# Patient Record
Sex: Male | Born: 1950 | Race: White | Hispanic: No | Marital: Married | Smoking: Never smoker
Health system: Southern US, Community
[De-identification: ages and names within clinical notes are randomized; demographics above are authoritative.]

## PROBLEM LIST (undated history)

## (undated) DIAGNOSIS — N401 Enlarged prostate with lower urinary tract symptoms: Secondary | ICD-10-CM

## (undated) DIAGNOSIS — C61 Malignant neoplasm of prostate: Secondary | ICD-10-CM

## (undated) DIAGNOSIS — Z87820 Personal history of traumatic brain injury: Secondary | ICD-10-CM

## (undated) DIAGNOSIS — G43109 Migraine with aura, not intractable, without status migrainosus: Secondary | ICD-10-CM

## (undated) DIAGNOSIS — N529 Male erectile dysfunction, unspecified: Secondary | ICD-10-CM

## (undated) DIAGNOSIS — Z973 Presence of spectacles and contact lenses: Secondary | ICD-10-CM

## (undated) DIAGNOSIS — Z87442 Personal history of urinary calculi: Secondary | ICD-10-CM

## (undated) HISTORY — PX: CYSTOSCOPY/URETEROSCOPY/HOLMIUM LASER: SHX6545

---

## 1976-11-21 DIAGNOSIS — Z87820 Personal history of traumatic brain injury: Secondary | ICD-10-CM

## 1976-11-21 HISTORY — DX: Personal history of traumatic brain injury: Z87.820

## 1994-11-21 HISTORY — PX: LUMBAR FUSION: SHX111

## 2017-09-07 HISTORY — PX: PROSTATE BIOPSY: SHX241

## 2017-09-26 ENCOUNTER — Encounter: Payer: Self-pay | Admitting: Radiation Oncology

## 2017-10-08 DIAGNOSIS — C61 Malignant neoplasm of prostate: Secondary | ICD-10-CM | POA: Insufficient documentation

## 2017-10-09 ENCOUNTER — Encounter: Payer: Self-pay | Admitting: Medical Oncology

## 2017-10-09 ENCOUNTER — Ambulatory Visit
Admission: RE | Admit: 2017-10-09 | Discharge: 2017-10-09 | Disposition: A | Discharge status: Home / Self Care | Payer: Medicare Other | Source: Ambulatory Visit | Attending: Radiation Oncology | Admitting: Radiation Oncology

## 2017-10-09 ENCOUNTER — Encounter: Payer: Self-pay | Admitting: Radiation Oncology

## 2017-10-09 ENCOUNTER — Other Ambulatory Visit: Payer: Self-pay

## 2017-10-09 VITALS — BP 145/95 | HR 57 | Ht 70.0 in | Wt 257.2 lb

## 2017-10-09 DIAGNOSIS — C61 Malignant neoplasm of prostate: Secondary | ICD-10-CM | POA: Diagnosis not present

## 2017-10-09 DIAGNOSIS — Z510 Encounter for antineoplastic radiation therapy: Secondary | ICD-10-CM | POA: Insufficient documentation

## 2017-10-09 DIAGNOSIS — Z7982 Long term (current) use of aspirin: Secondary | ICD-10-CM | POA: Diagnosis not present

## 2017-10-09 DIAGNOSIS — Z87442 Personal history of urinary calculi: Secondary | ICD-10-CM | POA: Diagnosis not present

## 2017-10-09 HISTORY — DX: Male erectile dysfunction, unspecified: N52.9

## 2017-10-09 HISTORY — DX: Malignant neoplasm of prostate: C61

## 2017-10-09 NOTE — Progress Notes (Addendum)
GU Location of Tumor / Histology: prostatic adenocarcinoma  If Prostate Cancer, Gleason Score is (4 + 3) and PSA is (7.90). Prostate volume: 53 cc.   [name] presented  Biopsies of prostate (if applicable) revealed:    Past/Anticipated interventions by urology, if any: biopsy, referral to radiation oncology to discuss options   Past/Anticipated interventions by medical oncology, if any: no  Weight changes, if any: 8 lb weight loss over 4 months due to increased exercise  Bowel/Bladder complaints, if any: Denies dysuria or hematuria. Reports increased nocturia. Reports one episode of leakage last week while pumping gas.   Nausea/Vomiting, if any: no  Pain issues, if any:  Left knee pain improving slowly without medical interention.  SAFETY ISSUES:  Prior radiation? no  Pacemaker/ICD? unknown  Possible current pregnancy? no  Is the patient on methotrexate? Unknown   Current Complaints / other details:  66 year old male. Married. Prefers to go by Jerry Gordon. Scheduled to follow up with Dr. Tresa Moore, urologist, on December 4. Daughter with hx of hodgkin's lymphoma behind breast bone.   Patient reports the following: 2015  PSA  2 (blood drawn in PCP office) 2016  PSA  4 (blood drawn in PCP office) 02/2017  PSA  7 (blood drawn in PCP office)  Explains a "dear friend recently had a prostatectomy."

## 2017-10-09 NOTE — Progress Notes (Signed)
I introduced myself to "Jerry Gordon" and his wife as the prostate nurse navigator and my role. He is here today to discuss his radiation treatment options. After discussion with Ebony Hail and Dr. Tammi Klippel, he needs to think about his options before making a decision.

## 2017-10-09 NOTE — Progress Notes (Signed)
See progress note under physician encounter. 

## 2017-10-09 NOTE — Progress Notes (Signed)
Radiation Oncology         [phone]) [phone] ________________________________  Initial outpatient Consultation  Name: Jerry Gordon MRN: [id number]  Date: 10/09/2017  DOB: October 02, 1951  HQ:[id number], No Pcp Per  Alexis Frock, MD   REFERRING PHYSICIAN: Alexis Frock, MD  DIAGNOSIS: 66 y.o. gentleman with stage T1c adenocarcinoma of the prostate with a Gleason's score of 4+3 and a PSA of 7.9    ICD-10-CM   1. Malignant neoplasm of prostate ([place]) C61     HISTORY OF PRESENT ILLNESS::Jerry Gordon is a 66 y.o. gentleman.  He goes by "Jerry Gordon". He was noted to have an elevated PSA of 7.9 in August 2018 by his primary care physician, Dr. Bartholome Bill.  Accordingly, he was referred for evaluation in urology by Dr. Tresa Moore on 08/31/17, digital rectal examination was performed at that time revealing no nodules.  The patient proceeded to transrectal ultrasound with 13 biopsies of the prostate on 09/07/17.  The prostate volume measured 53 cc. Out of 12 core biopsies, 6 were positive.  The maximum Gleason score was 4+3, and this was seen in the left mid gland.    The patient reviewed the biopsy results with his urologist and he has kindly been referred today for discussion of potential radiation treatment options.    PREVIOUS RADIATION THERAPY: No  PAST MEDICAL HISTORY:  Past Medical History:  Diagnosis Date  . Erectile dysfunction   . 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 Prostate cancer ([place])      PAST SURGICAL HISTORY: Past Surgical History:  Procedure Laterality Date  . BACK SURGERY  1992  . PROSTATE BIOPSY  09/07/2017    FAMILY HISTORY: Family History  Problem Relation Age of Onset  . Nephrolithiasis Father     SOCIAL HISTORY:  reports that  has never smoked. he has never used smokeless tobacco. He reports that he does not drink alcohol or use drugs. The patient is married and resides in [place]. 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  ALLERGIES: Patient has no known allergies.  MEDICATIONS:  Current Outpatient Medications  Medication Sig Dispense Refill  . ergocalciferol (VITAMIN D2) 50000 units capsule Take 50,000 Units once a week by mouth.    . Multiple Vitamin (MULTIVITAMIN) tablet Take 1 tablet daily by mouth.    Marland Kitchen aspirin EC 81 MG tablet Take by mouth.     No current facility-administered medications for this encounter.     REVIEW OF SYSTEMS:  On review of systems, the patient reports that he is doing well overall. He denies any chest pain, shortness of breath, cough, fevers, chills, night sweats. He reports 8 lb weight loss over 4 months due to increased exercise. He denies any bowel disturbances, and denies abdominal pain, nausea or vomiting. He denies any new musculoskeletal or joint aches or pains, new skin lesions or concerns.  The patient completed an IPSS and IIEF questionnaire. His IPSS score was 2 indicating mild urinary outflow obstructive symptoms.  He indicated that his erectile function is able to complete sexual activity. A complete review of systems is obtained and is otherwise negative.   PHYSICAL EXAM:   Wt Readings from Last 3 Encounters:  10/09/17 257 lb 3.2 oz (116.7 kg)   Temp Readings from Last 3 Encounters:  No data found for Temp   BP Readings from Last 3 Encounters:  No data found for BP   Pulse Readings from Last 3 Encounters:  No data found for Pulse  In general this is a well appearing caucasian male in no acute distress. He is accompanied by his wife today. He is alert and oriented x4 and appropriate throughout the examination. HEENT reveals that the patient is normocephalic, atraumatic. EOMs are intact. PERRLA. Skin is intact without any evidence of gross lesions. Cardiovascular exam reveals a regular rate and rhythm, no clicks rubs or murmurs are auscultated. Chest is clear to auscultation bilaterally. Lymphatic assessment is performed and does not reveal any adenopathy  in the cervical, supraclavicular, axillary, or inguinal chains. Abdomen has active bowel sounds in all quadrants and is intact. The abdomen is soft, non tender, non distended. Lower extremities are negative for pretibial pitting edema, deep calf tenderness, cyanosis or clubbing.  KPS = 100  100 - Normal; no complaints; no evidence of disease. 90   - Able to carry on normal activity; minor signs or symptoms of disease. 80   - Normal activity with effort; some signs or symptoms of disease. 27   - Cares for self; unable to carry on normal activity or to do active work. 60   - Requires occasional assistance, but is able to care for most of his personal needs. 50   - Requires considerable assistance and frequent medical care. 45   - Disabled; requires special care and assistance. 69   - Severely disabled; hospital admission is indicated although death not imminent. 45   - Very sick; hospital admission necessary; active supportive treatment necessary. 10   - Moribund; fatal processes progressing rapidly. 0     - Dead  Karnofsky DA, Abelmann WH, Craver LS and Burchenal JH [phone]) The use of the nitrogen mustards in the palliative treatment of carcinoma: with particular reference to bronchogenic carcinoma Cancer 1 [phone]   LABORATORY DATA:  No results found for: WBC, HGB, HCT, MCV, PLT No results found for: NA, K, CL, CO2 No results found for: ALT, AST, GGT, ALKPHOS, BILITOT   RADIOGRAPHY: No results found.    IMPRESSION/PLAN:  1. 66 y.o. gentleman with stage T1c adenocarcinoma of the prostate with a Gleason's score of 4+3 and a PSA of 7.9.  His Gleason's Score puts him into the unfavorable intermediate risk (UIR) group.  Accordingly he is eligible for a variety of potential treatment options including prostatectomy, external beam radiation, or prostate brachytherapy. Today Dr. Tammi Klippel reviewed the findings and workup thus far and discusses the implications of T-stage, Gleason's Score, and PSA on  decision-making and outcomes in prostate cancer.  We discussed radiation treatment in the management of prostate cancer with regard to the logistics and delivery of external beam radiation treatment as well as the logistics and delivery of prostate brachytherapy.  We compared and contrasted each of these approaches and also compared these against prostatectomy. The patient is continuing to consider his options for treatment, and we will follow up with him in one week to see how he'd like to proceed.  We will share our findings with Dr. Tresa Moore.    Carola Rhine, PAC    Tyler Pita, MD  [facility] Direct Dial: [phone]  Fax: [phone] [email or web address]  Skype  LinkedIn  This document serves as a record of services personally performed by Tyler Pita, MD and Shona Simpson, PA-C. It was created on their behalf by Arlyce Harman, a trained medical scribe. The creation of this record is based on the scribe's personal observations and the provider's statements to them. This document has been checked and approved by the attending  provider.  

## 2017-10-09 NOTE — Addendum Note (Signed)
Encounter addended by: Heywood Footman, RN on: 10/09/2017 4:45 PM  Actions taken: Charge Capture section accepted

## 2017-10-10 ENCOUNTER — Encounter: Payer: Self-pay | Admitting: Radiation Oncology

## 2017-10-10 NOTE — Addendum Note (Signed)
Encounter addended by: Heywood Footman, RN on: 10/10/2017 10:41 AM  Actions taken: Vitals modified, Sign clinical note, Visit Navigator Flowsheet section accepted

## 2017-10-11 ENCOUNTER — Telehealth: Payer: Self-pay | Admitting: Radiation Oncology

## 2017-10-11 ENCOUNTER — Telehealth: Payer: Self-pay | Admitting: *Deleted

## 2017-10-11 NOTE — Telephone Encounter (Signed)
The patient would like to proceed with seed implant and we will have our team reach out to him about scheduling

## 2017-10-11 NOTE — Telephone Encounter (Signed)
CALLED PATIENT TO INQUIRE ABOUT DOING IMPLANT , SPOKE WITH PATIENT AND HE HAS DECIDED TO HAVE THIS PROCEDURE, I EXPLAINED TO HIM THAT [employer] AREN'T OPEN UNTIL DEC. FOR NEXT YEAR, THEREFORE I WILL CALL HIM ON DEC. 3 AND UPDATE HIM @ THIS TIME

## 2017-10-23 ENCOUNTER — Telehealth: Payer: Self-pay | Admitting: Radiation Oncology

## 2017-10-23 NOTE — Telephone Encounter (Signed)
I spoke with the patient and he would like to move forward with seed implant. I will let Enid Derry know so she can schedule this.

## 2017-10-25 ENCOUNTER — Other Ambulatory Visit: Payer: Self-pay | Admitting: Urology

## 2017-10-25 ENCOUNTER — Telehealth: Payer: Self-pay | Admitting: *Deleted

## 2017-10-25 NOTE — Telephone Encounter (Signed)
Called patient to inform of pre-seed planning CT on 11-03-17 @ 1:30 pm and his implant and space oar on 12-20-17 @ 8:30 am, spoke with patient and he is aware of these appts.

## 2017-11-02 ENCOUNTER — Other Ambulatory Visit (HOSPITAL_COMMUNITY): Payer: Self-pay | Admitting: *Deleted

## 2017-11-02 ENCOUNTER — Telehealth: Payer: Self-pay | Admitting: *Deleted

## 2017-11-02 NOTE — Telephone Encounter (Signed)
Called patient to inform of pre-seed planning CT and chest x-ray and EKG for 11-03-17- arrival time- 1:15 pm, spoke with patient and he is aware of these appts.

## 2017-11-03 ENCOUNTER — Encounter (HOSPITAL_COMMUNITY)
Admission: RE | Admit: 2017-11-03 | Discharge: 2017-11-03 | Disposition: A | Discharge status: Home / Self Care | Payer: Medicare Other | Source: Ambulatory Visit | Attending: Urology | Admitting: Urology

## 2017-11-03 ENCOUNTER — Ambulatory Visit
Admission: RE | Admit: 2017-11-03 | Discharge: 2017-11-03 | Disposition: A | Discharge status: Home / Self Care | Payer: Medicare Other | Source: Ambulatory Visit | Attending: Radiation Oncology | Admitting: Radiation Oncology

## 2017-11-03 ENCOUNTER — Ambulatory Visit (HOSPITAL_COMMUNITY)
Admission: RE | Admit: 2017-11-03 | Discharge: 2017-11-03 | Disposition: A | Discharge status: Home / Self Care | Payer: Medicare Other | Source: Ambulatory Visit | Attending: Urology | Admitting: Urology

## 2017-11-03 ENCOUNTER — Other Ambulatory Visit: Payer: Self-pay | Admitting: Urology

## 2017-11-03 DIAGNOSIS — C61 Malignant neoplasm of prostate: Secondary | ICD-10-CM | POA: Insufficient documentation

## 2017-11-03 DIAGNOSIS — Z01818 Encounter for other preprocedural examination: Secondary | ICD-10-CM | POA: Diagnosis present

## 2017-11-03 DIAGNOSIS — R001 Bradycardia, unspecified: Secondary | ICD-10-CM | POA: Diagnosis not present

## 2017-11-03 DIAGNOSIS — Z01810 Encounter for preprocedural cardiovascular examination: Secondary | ICD-10-CM | POA: Diagnosis not present

## 2017-11-03 DIAGNOSIS — Z01811 Encounter for preprocedural respiratory examination: Secondary | ICD-10-CM

## 2017-11-03 DIAGNOSIS — Z510 Encounter for antineoplastic radiation therapy: Secondary | ICD-10-CM | POA: Diagnosis not present

## 2017-11-03 NOTE — Progress Notes (Signed)
  Radiation Oncology         [phone]) [phone] ________________________________  Name: Jerry Gordon MRN: [id number]  Date: 11/03/2017  DOB: 13-Mar-1951  SIMULATION AND TREATMENT PLANNING NOTE PUBIC ARCH STUDY  [id number], No Pcp Per  Alexis Frock, MD  DIAGNOSIS: 66 y.o. gentleman with stage T1c adenocarcinoma of the prostate with a Gleason's score of 4+3 and a PSA of 7.9     ICD-10-CM   1. Malignant neoplasm of prostate ([place]) C61     COMPLEX SIMULATION:  The patient presented today for evaluation for possible prostate seed implant. He was brought to the radiation planning suite and placed supine on the CT couch. A 3-dimensional image study set was obtained in upload to the planning computer. There, on each axial slice, I contoured the prostate gland. Then, using three-dimensional radiation planning tools I reconstructed the prostate in view of the structures from the transperineal needle pathway to assess for possible pubic arch interference. In doing so, I did not appreciate any pubic arch interference. Also, the patient's prostate volume was estimated based on the drawn structure. The volume was 40 cc.  Given the pubic arch appearance and prostate volume, patient remains a good candidate to proceed with prostate seed implant. Today, he freely provided informed written consent to proceed.    PLAN: The patient will undergo prostate seed implant.   ________________________________  Sheral Apley. Tammi Klippel, M.D.   Marland Kitchen

## 2017-11-29 ENCOUNTER — Encounter (HOSPITAL_BASED_OUTPATIENT_CLINIC_OR_DEPARTMENT_OTHER): Payer: Self-pay | Admitting: *Deleted

## 2017-11-29 ENCOUNTER — Other Ambulatory Visit: Payer: Self-pay

## 2017-11-29 NOTE — Progress Notes (Signed)
SPOKE W/ PT VIA PHONE FOR PRE-OP INTERVIEW.  NPO AFTER MN.  ARRIVE AT 0630.  GETTING LAB WORK DONE Wednesday 12-13-2017 AT 1400 (CBC,CMET,PT/INR,PTT).  CURRENT CXR AND EKG IN CHART AND Epic.  PT VERBALIZED UNDERSTANDING TO TO ONE FLEET ENEMA AM DOS.

## 2017-12-12 ENCOUNTER — Telehealth: Payer: Self-pay | Admitting: *Deleted

## 2017-12-12 NOTE — Telephone Encounter (Signed)
Called patient to remind of lab appt. for 12-13-17 @ 2 pm, spoke with patient and he is aware of this appt.

## 2017-12-13 ENCOUNTER — Encounter (HOSPITAL_COMMUNITY)
Admission: RE | Admit: 2017-12-13 | Discharge: 2017-12-13 | Disposition: A | Discharge status: Home / Self Care | Payer: Medicare Other | Source: Ambulatory Visit | Attending: Urology | Admitting: Urology

## 2017-12-13 DIAGNOSIS — C61 Malignant neoplasm of prostate: Secondary | ICD-10-CM | POA: Insufficient documentation

## 2017-12-13 DIAGNOSIS — Z01812 Encounter for preprocedural laboratory examination: Secondary | ICD-10-CM | POA: Insufficient documentation

## 2017-12-13 LAB — COMPREHENSIVE METABOLIC PANEL
ALT: 19 U/L (ref 17–63)
AST: 21 U/L (ref 15–41)
Albumin: 3.9 g/dL (ref 3.5–5.0)
Alkaline Phosphatase: 55 U/L (ref 38–126)
Anion gap: 8 (ref 5–15)
BUN: 17 mg/dL (ref 6–20)
CHLORIDE: 106 mmol/L (ref 101–111)
CO2: 24 mmol/L (ref 22–32)
CREATININE: 1.15 mg/dL (ref 0.61–1.24)
Calcium: 9 mg/dL (ref 8.9–10.3)
GFR calc non Af Amer: >60 mL/min (ref >60)
Glucose, Bld: 106 mg/dL — ABNORMAL HIGH (ref 65–99)
Potassium: 3.9 mmol/L (ref 3.5–5.1)
SODIUM: 138 mmol/L (ref 135–145)
Total Bilirubin: 0.2 mg/dL — ABNORMAL LOW (ref 0.3–1.2)
Total Protein: 7.2 g/dL (ref 6.5–8.1)

## 2017-12-13 LAB — CBC
HCT: 43.4 % (ref 39.0–52.0)
Hemoglobin: 14.7 g/dL (ref 13.0–17.0)
MCH: 30.5 pg (ref 26.0–34.0)
MCHC: 33.9 g/dL (ref 30.0–36.0)
MCV: 90 fL (ref 78.0–100.0)
PLATELETS: 204 10*3/uL (ref 150–400)
RBC: 4.82 MIL/uL (ref 4.22–5.81)
RDW: 13.5 % (ref 11.5–15.5)
WBC: 7.5 10*3/uL (ref 4.0–10.5)

## 2017-12-13 LAB — APTT: aPTT: 31 seconds (ref 24–36)

## 2017-12-13 LAB — PROTIME-INR
INR: 1.04
Prothrombin Time: 13.5 seconds (ref 11.4–15.2)

## 2017-12-19 ENCOUNTER — Telehealth: Payer: Self-pay | Admitting: *Deleted

## 2017-12-19 MED ORDER — GENTAMICIN SULFATE 40 MG/ML IJ SOLN
5.0000 mg/kg | Freq: Once | INTRAVENOUS | Status: AC
  Administered 2017-12-20: 450 mg via INTRAVENOUS
  Filled 2017-12-19 (×2): qty 11.25

## 2017-12-19 NOTE — Anesthesia Preprocedure Evaluation (Addendum)
Anesthesia Evaluation  Patient identified by MRN, date of birth, ID band Patient awake    Reviewed: Allergy & Precautions, NPO status , Patient's Chart, lab work & pertinent test results  Airway Mallampati: III  TM Distance: >3 FB Neck ROM: Full    Dental  (+) Dental Advisory Given   Pulmonary neg pulmonary ROS,    breath sounds clear to auscultation       Cardiovascular negative cardio ROS   Rhythm:Regular Rate:Normal     Neuro/Psych  Headaches,    GI/Hepatic negative GI ROS, Neg liver ROS,   Endo/Other  negative endocrine ROS  Renal/GU negative Renal ROS     Musculoskeletal   Abdominal (+) + obese,   Peds  Hematology negative hematology ROS (+)   Anesthesia Other Findings   Reproductive/Obstetrics                           Lab Results  Component Value Date   WBC 7.5 12/13/2017   HGB 14.7 12/13/2017   HCT 43.4 12/13/2017   MCV 90.0 12/13/2017   PLT 204 12/13/2017   Lab Results  Component Value Date   CREATININE 1.15 12/13/2017   BUN 17 12/13/2017   NA 138 12/13/2017   K 3.9 12/13/2017   CL 106 12/13/2017   CO2 24 12/13/2017    Anesthesia Physical Anesthesia Plan  ASA: II  Anesthesia Plan: General   Post-op Pain Management:    Induction: Intravenous  PONV Risk Score and Plan: 2 and Dexamethasone, Ondansetron and Treatment may vary due to age or medical condition  Airway Management Planned: LMA and Oral ETT  Additional Equipment:   Intra-op Plan:   Post-operative Plan: Extubation in OR  Informed Consent: I have reviewed the patients History and Physical, chart, labs and discussed the procedure including the risks, benefits and alternatives for the proposed anesthesia with the patient or authorized representative who has indicated his/her understanding and acceptance.   Dental advisory given  Plan Discussed with: CRNA  Anesthesia Plan Comments:         Anesthesia Quick Evaluation

## 2017-12-19 NOTE — Telephone Encounter (Signed)
CALLED PATIENT TO REMIND OF IMPLANT FOR 12-20-17, SPOKE WITH PATIENT AND HE IS AWARE OF THIS PROCEDURE.

## 2017-12-20 ENCOUNTER — Ambulatory Visit (HOSPITAL_BASED_OUTPATIENT_CLINIC_OR_DEPARTMENT_OTHER)
Admission: RE | Admit: 2017-12-20 | Discharge: 2017-12-20 | Disposition: A | Discharge status: Home / Self Care | Payer: Medicare Other | Source: Ambulatory Visit | Attending: Urology | Admitting: Urology

## 2017-12-20 ENCOUNTER — Encounter (HOSPITAL_BASED_OUTPATIENT_CLINIC_OR_DEPARTMENT_OTHER): Payer: Self-pay

## 2017-12-20 ENCOUNTER — Ambulatory Visit (HOSPITAL_COMMUNITY): Payer: Medicare Other

## 2017-12-20 ENCOUNTER — Ambulatory Visit (HOSPITAL_BASED_OUTPATIENT_CLINIC_OR_DEPARTMENT_OTHER): Payer: Medicare Other | Admitting: Anesthesiology

## 2017-12-20 ENCOUNTER — Encounter (HOSPITAL_BASED_OUTPATIENT_CLINIC_OR_DEPARTMENT_OTHER)
Admission: RE | Disposition: A | Discharge status: Home / Self Care | Payer: Self-pay | Source: Ambulatory Visit | Attending: Urology

## 2017-12-20 DIAGNOSIS — C61 Malignant neoplasm of prostate: Secondary | ICD-10-CM | POA: Diagnosis present

## 2017-12-20 DIAGNOSIS — Z87442 Personal history of urinary calculi: Secondary | ICD-10-CM | POA: Diagnosis not present

## 2017-12-20 DIAGNOSIS — Z7982 Long term (current) use of aspirin: Secondary | ICD-10-CM | POA: Diagnosis not present

## 2017-12-20 DIAGNOSIS — N400 Enlarged prostate without lower urinary tract symptoms: Secondary | ICD-10-CM | POA: Diagnosis not present

## 2017-12-20 HISTORY — PX: SPACE OAR INSTILLATION: SHX6769

## 2017-12-20 HISTORY — DX: Migraine with aura, not intractable, without status migrainosus: G43.109

## 2017-12-20 HISTORY — DX: Presence of spectacles and contact lenses: Z97.3

## 2017-12-20 HISTORY — DX: Benign prostatic hyperplasia with lower urinary tract symptoms: N40.1

## 2017-12-20 HISTORY — PX: RADIOACTIVE SEED IMPLANT: SHX5150

## 2017-12-20 HISTORY — DX: Personal history of traumatic brain injury: Z87.820

## 2017-12-20 HISTORY — DX: Personal history of urinary calculi: Z87.442

## 2017-12-20 SURGERY — INSERTION, RADIATION SOURCE, PROSTATE
Anesthesia: General

## 2017-12-20 MED ORDER — SODIUM CHLORIDE 0.9 % IJ SOLN
INTRAMUSCULAR | Status: DC | PRN
  Administered 2017-12-20: 10 mL via INTRAVENOUS

## 2017-12-20 MED ORDER — ONDANSETRON HCL 4 MG/2ML IJ SOLN
INTRAMUSCULAR | Status: DC | PRN
  Administered 2017-12-20: 4 mg via INTRAVENOUS

## 2017-12-20 MED ORDER — TAMSULOSIN HCL 0.4 MG PO CAPS: 0.4000 mg | ORAL_CAPSULE | Freq: Every day | ORAL | 11 refills | Status: AC | PRN

## 2017-12-20 MED ORDER — LACTATED RINGERS IV SOLN
INTRAVENOUS | Status: DC
  Administered 2017-12-20: 09:00:00 via INTRAVENOUS
  Administered 2017-12-20: 1000 mL via INTRAVENOUS
  Filled 2017-12-20: qty 1000

## 2017-12-20 MED ORDER — DEXAMETHASONE SODIUM PHOSPHATE 10 MG/ML IJ SOLN
INTRAMUSCULAR | Status: DC | PRN
  Administered 2017-12-20: 10 mg via INTRAVENOUS

## 2017-12-20 MED ORDER — FENTANYL CITRATE (PF) 100 MCG/2ML IJ SOLN
INTRAMUSCULAR | Status: AC
  Filled 2017-12-20: qty 2

## 2017-12-20 MED ORDER — TRAMADOL HCL 50 MG PO TABS: 50.0000 mg | ORAL_TABLET | Freq: Four times a day (QID) | ORAL | 0 refills | Status: AC | PRN

## 2017-12-20 MED ORDER — MIDAZOLAM HCL 5 MG/5ML IJ SOLN
INTRAMUSCULAR | Status: DC | PRN
  Administered 2017-12-20: 2 mg via INTRAVENOUS

## 2017-12-20 MED ORDER — MIDAZOLAM HCL 2 MG/2ML IJ SOLN
INTRAMUSCULAR | Status: AC
  Filled 2017-12-20: qty 2

## 2017-12-20 MED ORDER — PROPOFOL 10 MG/ML IV BOLUS
INTRAVENOUS | Status: AC
  Filled 2017-12-20: qty 40

## 2017-12-20 MED ORDER — FLEET ENEMA 7-19 GM/118ML RE ENEM
1.0000 | ENEMA | Freq: Once | RECTAL | Status: DC
  Filled 2017-12-20: qty 1

## 2017-12-20 MED ORDER — ONDANSETRON HCL 4 MG/2ML IJ SOLN
4.0000 mg | Freq: Once | INTRAMUSCULAR | Status: DC | PRN
  Filled 2017-12-20: qty 2

## 2017-12-20 MED ORDER — LIDOCAINE 2% (20 MG/ML) 5 ML SYRINGE
INTRAMUSCULAR | Status: AC
  Filled 2017-12-20: qty 5

## 2017-12-20 MED ORDER — ONDANSETRON HCL 4 MG/2ML IJ SOLN
INTRAMUSCULAR | Status: AC
  Filled 2017-12-20: qty 2

## 2017-12-20 MED ORDER — LIDOCAINE HCL (CARDIAC) 20 MG/ML IV SOLN
INTRAVENOUS | Status: DC | PRN
  Administered 2017-12-20: 100 mg via INTRAVENOUS

## 2017-12-20 MED ORDER — EPHEDRINE SULFATE 50 MG/ML IJ SOLN
INTRAMUSCULAR | Status: DC | PRN
  Administered 2017-12-20 (×2): 10 mg via INTRAVENOUS

## 2017-12-20 MED ORDER — STERILE WATER FOR IRRIGATION IR SOLN
Status: DC | PRN
  Administered 2017-12-20: 1000 mL

## 2017-12-20 MED ORDER — FENTANYL CITRATE (PF) 100 MCG/2ML IJ SOLN
INTRAMUSCULAR | Status: DC | PRN
  Administered 2017-12-20 (×2): 12.5 ug via INTRAVENOUS
  Administered 2017-12-20 (×7): 25 ug via INTRAVENOUS

## 2017-12-20 MED ORDER — FENTANYL CITRATE (PF) 100 MCG/2ML IJ SOLN
25.0000 ug | INTRAMUSCULAR | Status: DC | PRN
  Filled 2017-12-20: qty 1

## 2017-12-20 MED ORDER — PROPOFOL 10 MG/ML IV BOLUS
INTRAVENOUS | Status: DC | PRN
  Administered 2017-12-20: 300 mg via INTRAVENOUS

## 2017-12-20 MED ORDER — IOHEXOL 300 MG/ML  SOLN
INTRAMUSCULAR | Status: DC | PRN
  Administered 2017-12-20: 7 mL

## 2017-12-20 MED ORDER — DEXAMETHASONE SODIUM PHOSPHATE 10 MG/ML IJ SOLN
INTRAMUSCULAR | Status: AC
  Filled 2017-12-20: qty 1

## 2017-12-20 MED ORDER — KETOROLAC TROMETHAMINE 30 MG/ML IJ SOLN
INTRAMUSCULAR | Status: DC | PRN
  Administered 2017-12-20: 30 mg via INTRAVENOUS

## 2017-12-20 MED ORDER — KETOROLAC TROMETHAMINE 30 MG/ML IJ SOLN
INTRAMUSCULAR | Status: AC
Start: 2017-12-20 — End: 2017-12-20
  Filled 2017-12-20: qty 1

## 2017-12-20 MED ORDER — EPHEDRINE 5 MG/ML INJ
INTRAVENOUS | Status: AC
  Filled 2017-12-20: qty 10

## 2017-12-20 SURGICAL SUPPLY — 29 items
BAG URINE DRAINAGE (UROLOGICAL SUPPLIES) ×3 IMPLANT
BLADE CLIPPER SURG (BLADE) ×3 IMPLANT
CATH FOLEY 2WAY SLVR  5CC 16FR (CATHETERS) ×2
CATH FOLEY 2WAY SLVR 5CC 16FR (CATHETERS) ×1 IMPLANT
CATH ROBINSON RED A/P 16FR (CATHETERS) ×3 IMPLANT
CATH ROBINSON RED A/P 20FR (CATHETERS) ×3 IMPLANT
CLOTH BEACON ORANGE TIMEOUT ST (SAFETY) ×3 IMPLANT
COVER BACK TABLE 60X90IN (DRAPES) ×3 IMPLANT
COVER MAYO STAND STRL (DRAPES) ×3 IMPLANT
DRSG TEGADERM 4X4.75 (GAUZE/BANDAGES/DRESSINGS) ×6 IMPLANT
DRSG TEGADERM 8X12 (GAUZE/BANDAGES/DRESSINGS) ×6 IMPLANT
GAUZE SPONGE 4X4 12PLY STRL LF (GAUZE/BANDAGES/DRESSINGS) ×3 IMPLANT
GLOVE BIO SURGEON STRL SZ7.5 (GLOVE) ×3 IMPLANT
GLOVE ECLIPSE 8.0 STRL XLNG CF (GLOVE) ×3 IMPLANT
GOWN STRL REUS W/TWL LRG LVL3 (GOWN DISPOSABLE) ×3 IMPLANT
GOWN STRL REUS W/TWL XL LVL3 (GOWN DISPOSABLE) ×3 IMPLANT
HOLDER FOLEY CATH W/STRAP (MISCELLANEOUS) IMPLANT
IMPL SPACEOAR SYSTEM 10ML (MISCELLANEOUS) ×1 IMPLANT
IMPLANT SPACEOAR SYSTEM 10ML (MISCELLANEOUS) ×3
IV SOD CHL 0.9% 1000ML (IV SOLUTION) ×3 IMPLANT
KIT RM TURNOVER CYSTO AR (KITS) ×3 IMPLANT
PACK CYSTO (CUSTOM PROCEDURE TRAY) ×3 IMPLANT
SELECTSEED1-125 ×3 IMPLANT
SURGILUBE 2OZ TUBE FLIPTOP (MISCELLANEOUS) ×3 IMPLANT
SUT BONE WAX W31G (SUTURE) ×3 IMPLANT
SYRINGE 10CC LL (SYRINGE) ×3 IMPLANT
UNDERPAD 30X30 (UNDERPADS AND DIAPERS) ×3 IMPLANT
UNDERPAD 30X30 INCONTINENT (UNDERPADS AND DIAPERS) ×6 IMPLANT
WATER STERILE IRR 500ML POUR (IV SOLUTION) ×3 IMPLANT
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
## 2017-12-20 NOTE — OR Nursing (Signed)
RECTAL TUBE 16 FR RED ROBINSON CATH WAS PLACED AT 0856 AND TAPED TO BUTTOCKS AND WAS REMOVED AT THE END OF PROCEDURE

## 2017-12-20 NOTE — Anesthesia Postprocedure Evaluation (Signed)
Anesthesia Post Note  Patient: Jerry Gordon  Procedure(s) Performed: RADIOACTIVE SEED IMPLANT/BRACHYTHERAPY IMPLANT (N/A ) SPACE OAR INSTILLATION (N/A )     Patient location during evaluation: PACU Anesthesia Type: General Level of consciousness: awake and alert Pain management: pain level controlled Vital Signs Assessment: post-procedure vital signs reviewed and stable Respiratory status: spontaneous breathing, nonlabored ventilation, respiratory function stable and patient connected to nasal cannula oxygen Cardiovascular status: blood pressure returned to baseline and stable Postop Assessment: no apparent nausea or vomiting Anesthetic complications: no    Last Vitals:  Vitals:   12/20/17 1030 12/20/17 1045  BP: 116/67 122/63  Pulse: 74 81  Resp: 12 14  Temp:    SpO2: 97% 93%    Last Pain:  Vitals:   12/20/17 4742  TempSrc: Leatrice Jewels                 Tiajuana Amass

## 2017-12-20 NOTE — Discharge Instructions (Signed)
1 - You may have urinary urgency (bladder spasms) and bloody urine on / off x few days. This is normal.  2 - Call MD or go to ER for fever >102, severe pain / nausea / vomiting not relieved by medications, or acute change in medical status   No advil, motrin, aleve, ibuprofen naprosyn until 4 pm today    Radioactive Seed Implant Home Care Instructions   Activity:    Rest for the remainder of the day.  Do not drive or operate equipment today.  You may resume normal activities in a few days as instructed by your physician, without risk of harmful radiation exposure to those around you, provided you follow the time and distance precautions on the Radiation Oncology Instruction Sheet.  a Meals: Drink plenty of liquids and eat light foods, such as gelatin or soup this evening .  You may return to normal meal plan tomorrow.  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  Special Instruction:  Call your physician if any of these symptoms occur:   Persistent or heavy bleeding  Urine stream diminishes or stops completely after catheter is removed  Fever equal to or greater than 101 degrees F  Cloudy urine with a strong foul odor  Severe pain  You may feel some burning pain and/or hesitancy when you urinate after the catheter is removed.  These symptoms may increase over the next few weeks, but should diminish within forur to six weeks.  Applying moist heat to the lower abdomen or a hot tub bath may help relieve the pain.  If the discomfort becomes severe, please call your physician for additional medications.   Post Anesthesia Home Care Instructions  Activity: Get plenty of rest for the remainder of the day. A responsible individual must stay with you for 24 hours following the procedure.  For the next 24 hours, DO NOT: -Drive a car -Paediatric nurse -Drink alcoholic beverages -Take any medication unless instructed by your physician -Make any legal  decisions or sign important papers.  Meals: Start with liquid foods such as gelatin or soup. Progress to regular foods as tolerated. Avoid greasy, spicy, heavy foods. If nausea and/or vomiting occur, drink only clear liquids until the nausea and/or vomiting subsides. Call your physician if vomiting continues.  Special Instructions/Symptoms: Your throat may feel dry or sore from the anesthesia or the breathing tube placed in your throat during surgery. If this causes discomfort, gargle with warm salt water. The discomfort should disappear within 24 hours.  If you had a scopolamine patch placed behind your ear for the management of post- operative nausea and/or vomiting:  1. The medication in the patch is effective for 72 hours, after which it should be removed.  Wrap patch in a tissue and discard in the trash. Wash hands thoroughly with soap and water. 2. You may remove the patch earlier than 72 hours if you experience unpleasant side effects which may include dry mouth, dizziness or visual disturbances. 3. Avoid touching the patch. Wash your hands with soap and water after contact with the patch.

## 2017-12-20 NOTE — Brief Op Note (Signed)
12/20/2017  10:09 AM  PATIENT:  Jerry Gordon  67 y.o. male  PRE-OPERATIVE DIAGNOSIS:  PROSTATE CANCER  POST-OPERATIVE DIAGNOSIS:  PROSTATE CANCER  PROCEDURE:  Procedure(s): RADIOACTIVE SEED IMPLANT/BRACHYTHERAPY IMPLANT (N/A) SPACE OAR INSTILLATION (N/A)  SURGEON:  Surgeon(s) and Role:    * Alexis Frock, MD - Primary    * Tyler Pita, MD - Assisting  PHYSICIAN ASSISTANT:   ASSISTANTS: none   ANESTHESIA:   general  EBL:  0 mL   BLOOD ADMINISTERED:none  DRAINS: none   LOCAL MEDICATIONS USED:  NONE  SPECIMEN:  No Specimen  DISPOSITION OF SPECIMEN:  N/A  COUNTS:  YES  TOURNIQUET:  * No tourniquets in log *  DICTATION: .Other Dictation: Dictation Number [id number]  PLAN OF CARE: Discharge to home after PACU  PATIENT DISPOSITION:  PACU - hemodynamically stable.   Delay start of Pharmacological VTE agent (>24hrs) due to surgical blood loss or risk of bleeding: yes

## 2017-12-20 NOTE — Transfer of Care (Signed)
Immediate Anesthesia Transfer of Care Note  Patient: Jerry Gordon  Procedure(s) Performed: Procedure(s) (LRB): RADIOACTIVE SEED IMPLANT/BRACHYTHERAPY IMPLANT (N/A) SPACE OAR INSTILLATION (N/A)  Patient Location: PACU  Anesthesia Type: General  Level of Consciousness: awake, sedated, patient cooperative and responds to stimulation  Airway & Oxygen Therapy: Patient Spontanous Breathing and Patient connected to  O2  Post-op Assessment: Report given to PACU RN, Post -op Vital signs reviewed and stable and Patient moving all extremities  Post vital signs: Reviewed and stable  Complications: No apparent anesthesia complications

## 2017-12-20 NOTE — Progress Notes (Signed)
  Radiation Oncology         [phone] ________________________________  Name: Michiel Sivley MRN: [id number]  Date: 12/20/2017  DOB: 1950/11/28       Prostate Seed Implant  MC:NOBSJG, Rondel Oh, MD  No ref. provider found  DIAGNOSIS: 67 y.o. gentleman with stage T1c adenocarcinoma of the prostate with a Gleason's score of 4+3 and a PSA of 7.9  PROCEDURE: Insertion of radioactive I-125 seeds into the prostate gland.  RADIATION DOSE: 145 Gy, definitive therapy.  TECHNIQUE: Armistead Sult was brought to the operating room with the urologist. He was placed in the dorsolithotomy position. He was catheterized and a rectal tube was inserted. The perineum was shaved, prepped and draped. The ultrasound probe was then introduced into the rectum to see the prostate gland.  TREATMENT DEVICE: A needle grid was attached to the ultrasound probe stand and anchor needles were placed.  3D PLANNING: The prostate was imaged in 3D using a sagittal sweep of the prostate probe. These images were transferred to the planning computer. There, the prostate, urethra and rectum were defined on each axial reconstructed image. Then, the software created an optimized 3D plan and a few seed positions were adjusted. The quality of the plan was reviewed using [facility] information for the target and the following two organs at risk:  Urethra and Rectum.  Then the accepted plan was uploaded to the seed Selectron afterloading unit.  PROSTATE VOLUME STUDY:  Using transrectal ultrasound the volume of the prostate was verified to be 47.9 cc.  SPECIAL TREATMENT PROCEDURE/SUPERVISION AND HANDLING: The Nucletron FIRST system was used to place the needles under sagittal guidance. A total of 21 needles were used to deposit 74 seeds in the prostate gland. The individual seed activity was 0.469 mCi.  SpaceOAR:  Yes  COMPLEX SIMULATION: At the end of the procedure, an anterior radiograph of the pelvis was obtained to document seed positioning  and count. Cystoscopy was performed to check the urethra and bladder.  MICRODOSIMETRY: At the end of the procedure, the patient was emitting 0.056 mR/hr at 1 meter. Accordingly, he was considered safe for hospital discharge.  PLAN: The patient will return to the radiation oncology clinic for post implant CT dosimetry in three weeks.   ________________________________  Sheral Apley Tammi Klippel, M.D.

## 2017-12-20 NOTE — Anesthesia Procedure Notes (Signed)
Procedure Name: LMA Insertion Date/Time: 12/20/2017 8:46 AM Performed by: Justice Rocher, CRNA Pre-anesthesia Checklist: Patient identified, Emergency Drugs available, Suction available and Patient being monitored Patient Re-evaluated:Patient Re-evaluated prior to induction Oxygen Delivery Method: Circle system utilized Preoxygenation: Pre-oxygenation with 100% oxygen Induction Type: IV induction Ventilation: Mask ventilation without difficulty LMA: LMA inserted LMA Size: 5.0 Number of attempts: 1 Airway Equipment and Method: Bite block Placement Confirmation: positive ETCO2 and breath sounds checked- equal and bilateral Tube secured with: Tape Dental Injury: Teeth and Oropharynx as per pre-operative assessment

## 2017-12-21 NOTE — Op Note (Signed)
NAME:  Jerry Gordon, Jerry Gordon NO.:  MEDICAL RECORD NO.:  [id number]  LOCATION:                                 FACILITY:  PHYSICIAN:  Alexis Frock, MD          DATE OF BIRTH:  DATE OF PROCEDURE:  12/20/2017                              OPERATIVE REPORT   DIAGNOSIS:  Moderate risk adenocarcinoma of the prostate.  PROCEDURES: 1. Prostate brachytherapy seed implantation. 2. Cystoscopy. 3. SpaceOAR gel matrix implantation.  ESTIMATED BLOOD LOSS:  Nil.  COMPLICATIONS:  None.  SPECIMEN:  None.  RADIATION PARAMETERS:  74 seeds of Iodine-125 delivered across 21 catheters for prescribed dose of 145 Gy.  FINDINGS: 1. Excellent placement of brachytherapy seeds as per plan. 2. No evidence of intraluminal seeds following post implantation     cystoscopy. 3. Maximum placement of prerectal SpaceOAR gel with separation of     anterior rectal wall away from the posterior prostate.  INDICATIONS:  Mr. Menning is a pleasant 67 year old gentleman who was found on workup with elevated PSA to have adenocarcinoma of the prostate, moderate risk.  This prostate was approximately 55 g.  Options were discussed for definitive management including surveillance protocols versus ablative therapy versus surgical extirpation, and he adamantly wished to proceed with brachytherapy.  He is felt to be a suitable candidate.  Informed consent was obtained and placed in the medical record.  PROCEDURE IN DETAIL:  The patient being [name], was verified.  Procedure being prostate brachytherapy seed implantation was confirmed.  Procedure was carried out.  Time-out was performed. Intravenous antibiotics were administered.  General anesthesia was introduced.  The patient was placed into a high lithotomy position. Sterile field was created by prepping and draping the patient's perineum.  Transrectal ultrasound was placed as well as grid and stepper.  Radiation planning was performed  as per Radiation Oncology note with a plan for 21 catheters delivering 74 seeds.  Next, the 21 catheters were introduced sequentially from the anterior to the posterior direction, delivering the seeds as per prescribed.  The transrectal ultrasound stepper was then taken off anterior tension to maximize the space between the prostate and anterior rectal wall. Midgland of the prostate was visualized using ultrasound and the SpaceOAR finder needle was introduced into position within the anterior perirectal fat at the midgland of the prostate, 2 mL of saline were injected in a hydrodissection fashion, which corroborated excellent needle placement.  Next, 10 mL of SpaceOAR gel matrix was instilled over 10 seconds, which resulted in excellent displacement of the anterior rectal wall away from the posterior aspect of the prostate.  Next, the penis was prepped with iodine and cystourethroscopy was performed using a 16-French flexible cystoscope.  Flexible cystoscopy revealed unremarkable anterior and posterior urethra.  Inspection of the urinary bladder revealed very mild trabeculation.  No evidence of intraluminal seeds, no papillary lesions or calcifications noted.  Cystoscope was retroflexed, no additional findings were found.  It was removed, procedure was terminated.  The patient tolerated the procedure well.  There were no immediate periprocedural complications.  The patient was taken to the postanesthesia care unit in  stable condition.          ______________________________ Alexis Frock, MD     TM/MEDQ  D:  12/20/2017  T:  12/20/2017  Job:  [id number]

## 2017-12-22 ENCOUNTER — Encounter (HOSPITAL_BASED_OUTPATIENT_CLINIC_OR_DEPARTMENT_OTHER): Payer: Self-pay | Admitting: Urology

## 2018-01-01 ENCOUNTER — Telehealth: Payer: Self-pay | Admitting: *Deleted

## 2018-01-01 NOTE — Telephone Encounter (Signed)
CALLED PATIENT TO INFORM OF MRI ON 01-05-18 @ 12 PM, SPOKE WITH PATIENT AND HE IS AWARE OF THIS TEST

## 2018-01-04 ENCOUNTER — Telehealth: Payer: Self-pay | Admitting: *Deleted

## 2018-01-04 NOTE — Telephone Encounter (Signed)
Called patient to remind of post seed appts. for 01-05-18, no answer will call later

## 2018-01-05 ENCOUNTER — Ambulatory Visit (HOSPITAL_COMMUNITY)
Admission: RE | Admit: 2018-01-05 | Discharge: 2018-01-05 | Disposition: A | Discharge status: Home / Self Care | Payer: Medicare Other | Source: Ambulatory Visit | Attending: Urology | Admitting: Urology

## 2018-01-05 ENCOUNTER — Encounter: Payer: Self-pay | Admitting: Radiation Oncology

## 2018-01-05 ENCOUNTER — Ambulatory Visit (HOSPITAL_COMMUNITY)
Admit: 2018-01-05 | Discharge: 2018-01-05 | Disposition: A | Discharge status: Home / Self Care | Payer: Medicare Other | Source: Ambulatory Visit | Attending: Urology | Admitting: Urology

## 2018-01-05 ENCOUNTER — Ambulatory Visit
Admission: RE | Admit: 2018-01-05 | Discharge: 2018-01-05 | Disposition: A | Discharge status: Home / Self Care | Payer: Medicare Other | Source: Ambulatory Visit | Attending: Radiation Oncology | Admitting: Radiation Oncology

## 2018-01-05 ENCOUNTER — Other Ambulatory Visit: Payer: Self-pay

## 2018-01-05 DIAGNOSIS — C61 Malignant neoplasm of prostate: Secondary | ICD-10-CM | POA: Insufficient documentation

## 2018-01-05 DIAGNOSIS — Z1389 Encounter for screening for other disorder: Secondary | ICD-10-CM | POA: Diagnosis not present

## 2018-01-05 DIAGNOSIS — Z79899 Other long term (current) drug therapy: Secondary | ICD-10-CM | POA: Diagnosis not present

## 2018-01-05 DIAGNOSIS — Z5309 Procedure and treatment not carried out because of other contraindication: Secondary | ICD-10-CM

## 2018-01-05 DIAGNOSIS — Z510 Encounter for antineoplastic radiation therapy: Secondary | ICD-10-CM | POA: Diagnosis not present

## 2018-01-05 DIAGNOSIS — Z79891 Long term (current) use of opiate analgesic: Secondary | ICD-10-CM | POA: Diagnosis not present

## 2018-01-05 DIAGNOSIS — N3941 Urge incontinence: Secondary | ICD-10-CM | POA: Insufficient documentation

## 2018-01-05 DIAGNOSIS — Y842 Radiological procedure and radiotherapy as the cause of abnormal reaction of the patient, or of later complication, without mention of misadventure at the time of the procedure: Secondary | ICD-10-CM | POA: Insufficient documentation

## 2018-01-05 DIAGNOSIS — Z7982 Long term (current) use of aspirin: Secondary | ICD-10-CM | POA: Diagnosis not present

## 2018-01-05 NOTE — Progress Notes (Signed)
Radiation Oncology         [phone] ________________________________  Name: Jerry Gordon MRN: [id number]  Date: 01/05/2018  DOB: November 05, 1951  Post-Seed Follow-Up Visit Note  CC: Jerry File, MD  Jerry Frock, MD  Diagnosis:   67 y.o. male with stage T1c adenocarcinoma of the prostate with a Gleason's score of 4+3 and a PSA of 7.9    ICD-10-CM   1. Malignant neoplasm of prostate ([place]) C61     Interval Since Last Radiation:  2 weeks  12/20/17:  Insertion of radioactive I-125 seeds into the prostate gland, 145 Gy definitive therapy, with SpaceOAR  Narrative:  The patient returns today for routine follow-up.  He is complaining of increased urinary frequency and urinary hesitation symptoms. He filled out a questionnaire regarding urinary function today providing and overall IPSS score of 22 characterizing his symptoms as severe with urgency, frequency, weak stream, occasional small volume urge incontinence if he postpones urination and occasional feelings of incomplete bladder emptying.  His pre-implant score was 2.  He reported resolution of dysuria and some improvement with emptying his bladder.  He has a prescription of Flomax but has not been using this regularly.  He had been experiencing some mild diarrhea but this has since resolved and in fact, today he is feeling somewhat constipated.  He denies gross hematuria or any foul odor to his urine.  He has not had recent fever, chills, nausea or vomiting.  He reports a healthy appetite and is maintaining his weight.  He does continue with modest fatigue but is interested in getting back into the gym.   ALLERGIES:  has No Known Allergies.  Meds: Current Outpatient Medications  Medication Sig Dispense Refill  . aspirin EC 81 MG tablet Take 81 mg by mouth every other day.     . ergocalciferol (VITAMIN D2) 50000 units capsule Take 50,000 Units by mouth 2 (two) times a week.     . Multiple Vitamin (MULTIVITAMIN) tablet Take 1 tablet  daily by mouth.    . tamsulosin (FLOMAX) 0.4 MG CAPS capsule Take 1 capsule (0.4 mg total) by mouth daily as needed. For urinary urgency / hesitancy after brachytherapy 30 capsule 11  . traMADol (ULTRAM) 50 MG tablet Take 1 tablet (50 mg total) by mouth every 6 (six) hours as needed for moderate pain. Post-operatively (Patient not taking: Reported on 01/05/2018) 20 tablet 0   No current facility-administered medications for this encounter.     Physical Findings: In general this is a well appearing Caucasian male in no acute distress. He's alert and oriented x4 and appropriate throughout the examination. Cardiopulmonary assessment is negative for acute distress and he exhibits normal effort.   Lab Findings: Lab Results  Component Value Date   WBC 7.5 12/13/2017   HGB 14.7 12/13/2017   HCT 43.4 12/13/2017   MCV 90.0 12/13/2017   PLT 204 12/13/2017    Radiographic Findings:  Patient underwent CT imaging in our clinic for post implant dosimetry. The CT was reviewed by Dr. Tammi Klippel and appears to demonstrate an adequate distribution of radioactive seeds throughout the prostate gland. There are no seeds in or near the rectum.  He is scheduled for prostate MRI for verification of SpaceOAR today at 12 noon and this will be fused with his CT imaging for further evaluation.  We suspect the final radiation plan and dosimetry will show appropriate coverage of the prostate gland.   Impression/Plan: The patient is recovering from the effects of radiation. His  urinary symptoms should gradually improve over the next 4-6 months. We talked about this today.  I have encouraged him to resume using Flomax daily to help improve his bladder emptying and reduce the urgency and frequency.  He is encouraged by his improvement already and is otherwise pleased with his outcome. We also talked about long-term follow-up for prostate cancer following seed implant. He understands that ongoing PSA determinations and digital  rectal exams will help perform surveillance to rule out disease recurrence. He has a follow up appointment scheduled with Dr. Tresa Moore on 03/20/18. He understands what to expect with his PSA measures. Patient was also educated today about some of the long-term effects from radiation including a small risk for rectal bleeding and possibly erectile dysfunction. We talked about some of the general management approaches to these potential complications. However, I did encourage the patient to contact our office or return at any point if he has questions or concerns related to his previous radiation and prostate cancer.    Nicholos Johns, PA-C    Tyler Pita, MD  [facility] Direct Dial: [phone]  Fax: [phone] .com  Skype  LinkedIn    Page Me   This document serves as a record of services personally performed by Tyler Pita, MD and Freeman Caldron, PA-C. It was created on their behalf by Rae Lips, a trained medical scribe. The creation of this record is based on the scribe's personal observations and the providers' statements to them. This document has been checked and approved by the attending providers.

## 2018-01-05 NOTE — Progress Notes (Signed)
  Radiation Oncology         [phone]) [phone] ________________________________  Name: Dwane Andres MRN: [id number]  Date: 01/05/2018  DOB: [birth year]  COMPLEX SIMULATION NOTE  NARRATIVE:  The patient was brought to the [facility] today following prostate seed implantation approximately one month ago.  Identity was confirmed.  All relevant records and images related to the planned course of therapy were reviewed.  Then, the patient was set-up supine.  CT images were obtained.  The CT images were loaded into the planning software.  Then the prostate and rectum were contoured.  Treatment planning then occurred.  The implanted iodine 125 seeds were identified by the physics staff for projection of radiation distribution  I have requested : 3D Simulation  I have requested a DVH of the following structures: Prostate and rectum.    ________________________________  Sheral Apley Tammi Klippel, M.D.  This document serves as a record of services personally performed by Tyler Pita, MD. It was created on his behalf by Rae Lips, a trained medical scribe. The creation of this record is based on the scribe's personal observations and the provider's statements to them. This document has been checked and approved by the attending provider.

## 2018-01-05 NOTE — Progress Notes (Signed)
Patient returns today for post seed follow up. Patient alert and oriented to person, place and time. No distress noted. Vitals stable. Denies pain. Weight stable. Pre seed IPSS 2. Post seed IPSS 22. Reports urgency. Reports incontinence three times yesterday. Reports new onset constipation. Reports taking flomax but not as directed. Denies dysuria or hematuria. Scheduled for MRI today at noon to confirm spaceoar placement. Scheduled to follow up at [facility] on 03/05/2018.  BP 113/73 (BP Location: Left Arm, Patient Position: Sitting, Cuff Size: Large)   Pulse 76   Temp 98 F (36.7 C) (Oral)   Resp 18   Wt 261 lb 9.6 oz (118.7 kg)   SpO2 100%   BMI 37.54 kg/m  Wt Readings from Last 3 Encounters:  01/05/18 261 lb 9.6 oz (118.7 kg)  12/20/17 260 lb 4.8 oz (118.1 kg)  10/09/17 257 lb 3.2 oz (116.7 kg)

## 2018-01-09 ENCOUNTER — Telehealth: Payer: Self-pay | Admitting: *Deleted

## 2018-01-09 ENCOUNTER — Other Ambulatory Visit: Payer: Self-pay | Admitting: Urology

## 2018-01-09 MED ORDER — LORAZEPAM 1 MG PO TABS: 1.0000 mg | ORAL_TABLET | ORAL | 0 refills | Status: AC | PRN

## 2018-01-09 NOTE — Telephone Encounter (Signed)
Called patient to inform of appt. for MRI on 01-15-18 - arrival time - 9:45 am @ [facility], no restrictions to test, unable to leave message will call later

## 2018-01-09 NOTE — Telephone Encounter (Signed)
CALLED PATIENT TO INFORM OF MRI ON 01-15-18- ARRIVAL TIME- 9:45 AM @ [facility], ASHLYN TO CALL IN SCRIPT FOR PATIENT SO HE CAN BE RELAXED FOR SCAN, SPOKE WITH PATIENT AND HE AGREED TO HAVE THIS DONE PROVIDED MEDS. IS ORDERED.

## 2018-01-15 ENCOUNTER — Ambulatory Visit
Admission: RE | Admit: 2018-01-15 | Discharge: 2018-01-15 | Disposition: A | Discharge status: Home / Self Care | Payer: Medicare Other | Source: Ambulatory Visit | Attending: Radiation Oncology | Admitting: Radiation Oncology

## 2018-01-15 ENCOUNTER — Ambulatory Visit (HOSPITAL_COMMUNITY)
Admission: RE | Admit: 2018-01-15 | Discharge: 2018-01-15 | Disposition: A | Discharge status: Home / Self Care | Payer: Medicare Other | Source: Ambulatory Visit | Attending: Urology | Admitting: Urology

## 2018-01-15 VITALS — BP 137/75 | HR 72 | Temp 98.2°F | Resp 18

## 2018-01-15 DIAGNOSIS — C61 Malignant neoplasm of prostate: Secondary | ICD-10-CM

## 2018-01-15 DIAGNOSIS — Z510 Encounter for antineoplastic radiation therapy: Secondary | ICD-10-CM | POA: Diagnosis not present

## 2018-01-15 MED ORDER — LORAZEPAM 0.5 MG PO TABS
0.5000 mg | ORAL_TABLET | ORAL | Status: AC
  Administered 2018-01-15: 0.5 mg via ORAL
  Filled 2018-01-15: qty 1

## 2018-01-15 NOTE — Progress Notes (Signed)
Patient scheduled for MRI today. Patient presented to clinic because he dropped his Ativan 0.5 mg tablet in the parking lot. Patient reports he requires Ativan for MRI. Vital stable. As ordered by Shona Simpson, PA-C 0.5 mg tablet of ativan was administered. Patient's wife confirms she will drive him home after MRI. Patient left clinic ambulatory with his wife in no distress.

## 2018-01-24 ENCOUNTER — Encounter: Payer: Self-pay | Admitting: Radiation Oncology

## 2018-01-28 NOTE — Progress Notes (Signed)
  Radiation Oncology         [phone]) [phone] ________________________________  Name: Jerry Gordon MRN: [id number]  Date: 01/24/2018  DOB: December 26, 1950  3D Planning Note   Prostate Brachytherapy Post-Implant Dosimetry  Diagnosis: 67 y.o. gentleman with stage T1c adenocarcinoma of the prostate with a Gleason's score of 4+3 and a PSA of 7.9   Narrative: On a previous date, Jerry Gordon returned following prostate seed implantation for post implant planning. He underwent CT scan complex simulation to delineate the three-dimensional structures of the pelvis and demonstrate the radiation distribution.  Since that time, the seed localization, and complex isodose planning with dose volume histograms have now been completed.  Results:   Prostate Coverage - The dose of radiation delivered to the 90% or more of the prostate gland (D90) was 114.92% of the prescription dose. This exceeds our goal of greater than 90%. Rectal Sparing - The volume of rectal tissue receiving the prescription dose or higher was 0.0 cc. This falls under our thresholds tolerance of 1.0 cc.  Impression: The prostate seed implant appears to show adequate target coverage and appropriate rectal sparing.  Plan:  The patient will continue to follow with urology for ongoing PSA determinations. I would anticipate a high likelihood for local tumor control with minimal risk for rectal morbidity.  ________________________________  Sheral Apley Tammi Klippel, M.D.

## 2018-11-30 IMAGING — DX DG ORBITS FOR FOREIGN BODY
2 series · 2 of 2 positions shown · non-contrast
Comparison: None.

CLINICAL DATA: Metal working/exposure; clearance prior to MRI

EXAM:
ORBITS FOR FOREIGN BODY - 2 VIEW

[orbits waters (1 of 2)]
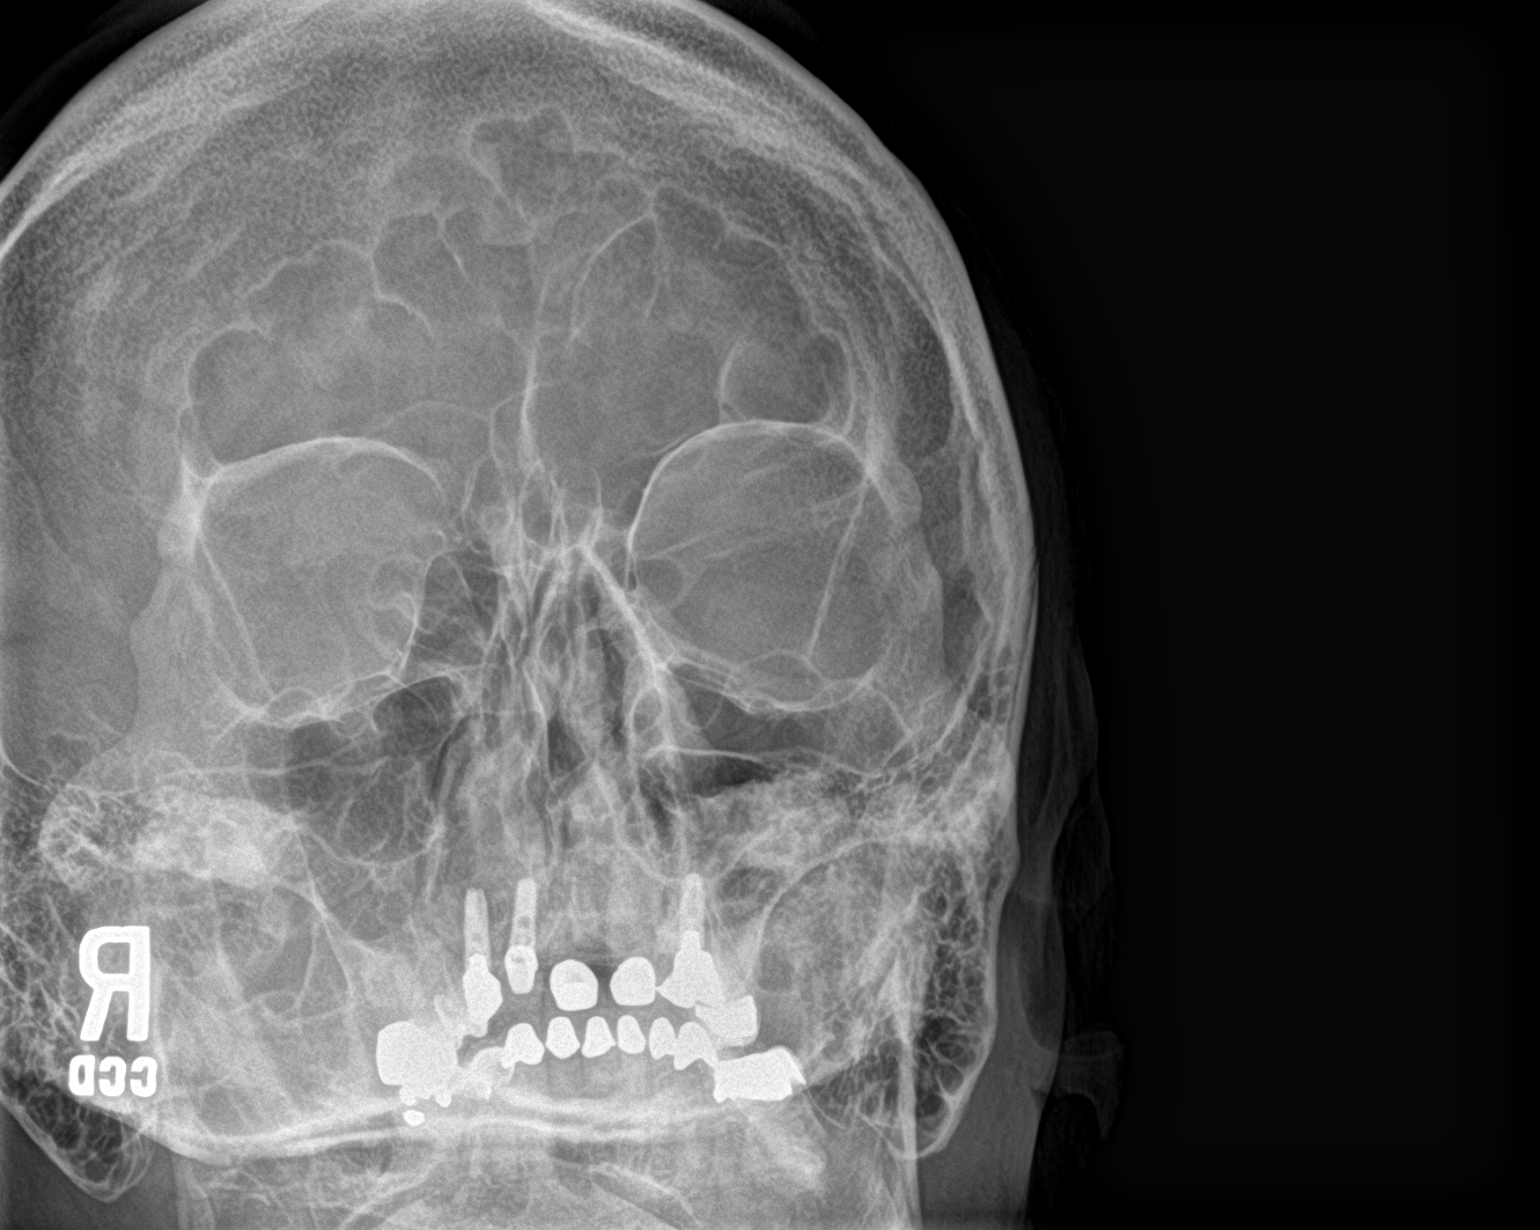

[orbits waters (2 of 2)]
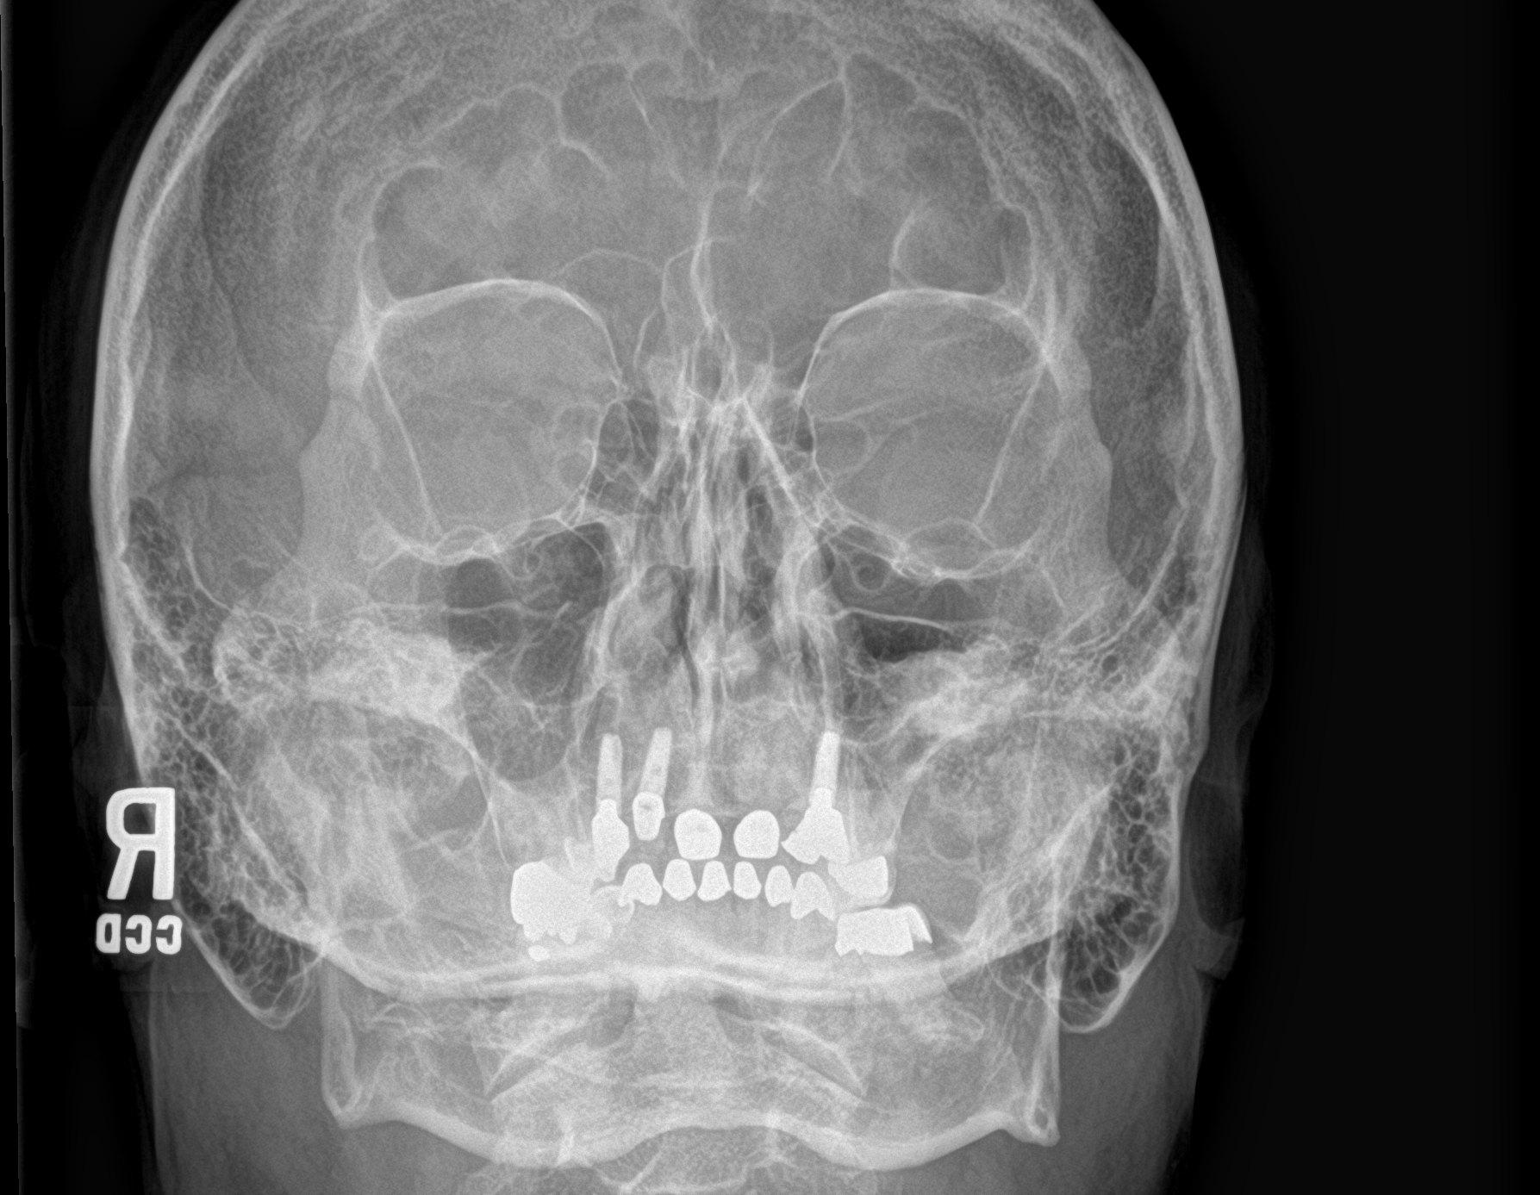

[2 of 2 positions shown; findings below may reference images not displayed]

FINDINGS: There is no evidence of metallic foreign body within the orbits. No
significant bone abnormality identified.
IMPRESSION: No evidence of metallic foreign body within the orbits.
